# Patient Record
Sex: Female | Born: 1995 | Race: White | Hispanic: No | Marital: Single | Smoking: Never smoker
Health system: Southern US, Community
[De-identification: ages and names within clinical notes are randomized; demographics above are authoritative.]

## PROBLEM LIST (undated history)

## (undated) HISTORY — PX: WISDOM TOOTH EXTRACTION: SHX21

## (undated) HISTORY — PX: KNEE SURGERY: SHX244

## (undated) HISTORY — PX: TONSILLECTOMY: SUR1361

---
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
## 2013-02-15 ENCOUNTER — Ambulatory Visit: Payer: Self-pay | Admitting: Physician Assistant

## 2013-02-15 LAB — URINALYSIS, COMPLETE
BILIRUBIN, UR: NEGATIVE
Glucose,UR: NEGATIVE mg/dL (ref 0–75)
Ketone: NEGATIVE
LEUKOCYTE ESTERASE: NEGATIVE
Nitrite: NEGATIVE
PROTEIN: NEGATIVE
Ph: 6 (ref 4.5–8.0)
Specific Gravity: 1.025 (ref 1.003–1.030)

## 2013-02-15 LAB — PREGNANCY, URINE: Pregnancy Test, Urine: NEGATIVE m[IU]/mL

## 2015-02-01 DIAGNOSIS — Z3493 Encounter for supervision of normal pregnancy, unspecified, third trimester: Secondary | ICD-10-CM | POA: Insufficient documentation

## 2015-04-20 DIAGNOSIS — Z975 Presence of (intrauterine) contraceptive device: Secondary | ICD-10-CM | POA: Insufficient documentation

## 2017-03-21 ENCOUNTER — Other Ambulatory Visit: Payer: Self-pay

## 2017-03-21 ENCOUNTER — Ambulatory Visit
Admission: EM | Admit: 2017-03-21 | Discharge: 2017-03-21 | Disposition: A | Discharge status: Home / Self Care | Payer: Federal, State, Local not specified - PPO | Attending: Family Medicine | Admitting: Family Medicine

## 2017-03-21 DIAGNOSIS — N3000 Acute cystitis without hematuria: Secondary | ICD-10-CM

## 2017-03-21 LAB — URINALYSIS, COMPLETE (UACMP) WITH MICROSCOPIC
BILIRUBIN URINE: NEGATIVE
Bacteria, UA: NONE SEEN
GLUCOSE, UA: NEGATIVE mg/dL
Hgb urine dipstick: NEGATIVE
KETONES UR: NEGATIVE mg/dL
NITRITE: NEGATIVE
Protein, ur: NEGATIVE mg/dL
RBC / HPF: NONE SEEN RBC/hpf (ref 0–5)
Specific Gravity, Urine: 1.015 (ref 1.005–1.030)
pH: 8.5 — ABNORMAL HIGH (ref 5.0–8.0)

## 2017-03-21 LAB — PREGNANCY, URINE: Preg Test, Ur: NEGATIVE

## 2017-03-21 MED ORDER — CEPHALEXIN 500 MG PO CAPS: 500.0000 mg | ORAL_CAPSULE | Freq: Two times a day (BID) | ORAL | 0 refills | Status: DC

## 2017-03-21 NOTE — Discharge Instructions (Signed)
Antibiotic as prescribed.  If you worsen, please be re-evaluated.  Take care  Dr. Adriana Simasook

## 2017-03-21 NOTE — ED Triage Notes (Signed)
Patient complains of urinary urgency, frequency and lower abdominal pain. Patient states that she had UTI symptoms around 3 weeks ago and took some leftover antibiotics from her mother. Patient states that the symptoms returned last night.

## 2017-03-21 NOTE — ED Provider Notes (Signed)
[facility]    CSN: [id number] Arrival date & time: 03/21/17  0801  History   Chief Complaint Chief Complaint  Patient presents with  . Dysuria   HPI  22 year old female presents with lower abdominal pain.  Patient states that approximately 3 weeks ago she felt as if she had a UTI.  She took some antibiotics from her mother.  She states that her symptoms resolved.  Last night she developed lower abdominal pain.  She states that she has had associated urinary frequency and strong odor to her urine.  She states that she has had pain with intercourse recently as well.  No fevers or chills.  No known exacerbating relieving factors.  No reports of flank pain.  No other associated symptoms.  No other complaints at this time.  PMH - Anxiety, Depression, Hx of STD  Past Surgical History:  Procedure Laterality Date  . KNEE SURGERY Right   . TONSILLECTOMY    . WISDOM TOOTH EXTRACTION      Home Medications    Family History Thyroid disease Brother    Endometrial cancer Maternal Grandmother    Hypertension Maternal Grandmother    Hypothyroidism Mother     Social History Social History   Tobacco Use  . Smoking status: Never Smoker  . Smokeless tobacco: Never Used  Substance Use Topics  . Alcohol use: Yes    Comment: occasionally  . Drug use: No   Allergies   Sulfa antibiotics   Review of Systems Review of Systems  Constitutional: Negative.   Gastrointestinal: Positive for abdominal pain.  Genitourinary: Positive for dysuria and frequency.   Physical Exam Triage Vital Signs ED Triage Vitals  Enc Vitals Group     BP 03/21/17 0817 116/61     Pulse Rate 03/21/17 0817 93     Resp 03/21/17 0817 18     Temp 03/21/17 0817 98.9 F (37.2 C)     Temp Source 03/21/17 0817 Oral     SpO2 03/21/17 0817 99 %     Weight 03/21/17 0814 210 lb (95.3 kg)     Height 03/21/17 0814 5\' 6"  (1.676 m)     Head Circumference --      Peak Flow --      Pain Score  03/21/17 0814 5     Pain Loc --      Pain Edu? --      Excl. in GC? --    Updated Vital Signs BP 116/61 (BP Location: Left Arm)   Pulse 93   Temp 98.9 F (37.2 C) (Oral)   Resp 18   Ht 5\' 6"  (1.676 m)   Wt 210 lb (95.3 kg)   LMP  (LMP Unknown) Comment: irregular cycles  SpO2 99%   BMI 33.89 kg/m   Physical Exam  Constitutional: She is oriented to person, place, and time. She appears well-developed and well-nourished. No distress.  Cardiovascular: Normal rate and regular rhythm.  No murmur heard. Pulmonary/Chest: Effort normal and breath sounds normal. She has no wheezes. She has no rales.  Abdominal: Soft.  Nondistended.  Mild suprapubic tenderness.  Neurological: She is alert and oriented to person, place, and time.  Psychiatric: She has a normal mood and affect. Her behavior is normal.  Nursing note and vitals reviewed.  UC Treatments / Results  Labs (all labs ordered are listed, but only abnormal results are displayed) Labs Reviewed  URINALYSIS, COMPLETE (UACMP) WITH MICROSCOPIC - Abnormal; Notable for the following components:  Result Value   pH 8.5 (*)    Leukocytes, UA TRACE (*)    Squamous Epithelial / LPF 6-30 (*)    All other components within normal limits  URINE CULTURE  PREGNANCY, URINE    EKG  EKG Interpretation None       Radiology No results found.  Procedures Procedures (including critical care time)  Medications Ordered in UC Medications - No data to display   Initial Impression / Assessment and Plan / UC Course  I have reviewed the triage vital signs and the nursing notes.  Pertinent labs & imaging results that were available during my care of the patient were reviewed by me and considered in my medical decision making (see chart for details).    22 year old female presents with concerns for UTI.  Trace leukocytes on urinalysis.  Given recent symptoms as well as likely incomplete and inappropriate treatment, I am sending culture  and treating empirically with Keflex.  Final Clinical Impressions(s) / UC Diagnoses   Final diagnoses:  Acute cystitis without hematuria    ED Discharge Orders        Ordered    cephALEXin (KEFLEX) 500 MG capsule  2 times daily     03/21/17 0837     Controlled Substance Prescriptions [place] Controlled Substance Registry consulted? Not Applicable   Tommie SamsCook, Dwanda Tufano G, [place] 03/21/17 [phone]

## 2017-03-22 LAB — URINE CULTURE

## 2017-03-25 DIAGNOSIS — Z3041 Encounter for surveillance of contraceptive pills: Secondary | ICD-10-CM | POA: Insufficient documentation

## 2017-03-25 DIAGNOSIS — Z6839 Body mass index (BMI) 39.0-39.9, adult: Secondary | ICD-10-CM | POA: Insufficient documentation

## 2017-04-02 ENCOUNTER — Telehealth: Payer: Self-pay | Admitting: Emergency Medicine

## 2017-04-02 NOTE — Telephone Encounter (Signed)
Patient called after received letter re: lab results. Advised patient of urine culture results. Patient states that she was not getting better and went to the ED for evaluation and was diagnosed with something else.

## 2017-08-11 DIAGNOSIS — R519 Headache, unspecified: Secondary | ICD-10-CM | POA: Insufficient documentation

## 2017-10-26 DIAGNOSIS — O3680X0 Pregnancy with inconclusive fetal viability, not applicable or unspecified: Secondary | ICD-10-CM | POA: Insufficient documentation

## 2018-09-06 ENCOUNTER — Other Ambulatory Visit: Payer: Self-pay

## 2018-09-06 ENCOUNTER — Ambulatory Visit
Admission: EM | Admit: 2018-09-06 | Discharge: 2018-09-06 | Disposition: A | Discharge status: Home / Self Care | Payer: Federal, State, Local not specified - PPO | Attending: Family Medicine | Admitting: Family Medicine

## 2018-09-06 DIAGNOSIS — H00011 Hordeolum externum right upper eyelid: Secondary | ICD-10-CM | POA: Diagnosis not present

## 2018-09-06 MED ORDER — POLYMYXIN B-TRIMETHOPRIM 10000-0.1 UNIT/ML-% OP SOLN: 1.0000 [drp] | Freq: Four times a day (QID) | OPHTHALMIC | 0 refills | Status: AC

## 2018-09-06 NOTE — ED Provider Notes (Signed)
[facility]    CSN: [id number] Arrival date & time: 09/06/18  0854  History   Chief Complaint Chief Complaint  Patient presents with  . Eye Problem   HPI  23 year old female presents with the above complaint.  Patient reports that she suffered some eyelid pain approximately 3 days ago.  Did not notice swelling until yesterday.  She reports right upper eyelid swelling.  No drainage from the eye.  No eye redness.  Mild pain in the right upper eyelid.  Tender to touch and worse with blinking.  No known inciting factor.  No medications for interventions tried.  No known exacerbating factors.  No other complaints.  History reviewed as below. PMH - Anxiety, Depression, Hx of STD  Past Surgical History:  Procedure Laterality Date  . KNEE SURGERY Right   . TONSILLECTOMY    . WISDOM TOOTH EXTRACTION      OB History   No obstetric history on file.    Home Medications    Prior to Admission medications   Medication Sig Start Date End Date Taking? Authorizing Provider  NIKKI 3-0.02 MG tablet  07/16/18  Yes [provider]  cephALEXin (KEFLEX) 500 MG capsule Take 1 capsule (500 mg total) by mouth 2 (two) times daily. 03/21/17   Tommie Samsook, Breniyah Romm G, DO  trimethoprim-polymyxin b (POLYTRIM) ophthalmic solution Place 1 drop into the right eye every 6 (six) hours for 5 days. 09/06/18 09/11/18  Tommie Samsook, Aalyah Mansouri G, DO    Family History Family History  Problem Relation Age of Onset  . Healthy Mother   . Healthy Father     Social History Social History   Tobacco Use  . Smoking status: Never Smoker  . Smokeless tobacco: Never Used  Substance Use Topics  . Alcohol use: Yes    Comment: occasionally  . Drug use: No     Allergies   Sulfa antibiotics   Review of Systems Review of Systems  Constitutional: Negative.   Eyes: Negative for discharge, redness and visual disturbance.       Right upper eyelid swelling.   Physical Exam Triage Vital Signs ED Triage Vitals  Enc  Vitals Group     BP 09/06/18 0906 114/80     Pulse Rate 09/06/18 0906 86     Resp 09/06/18 0906 18     Temp 09/06/18 0906 99 F (37.2 C)     Temp Source 09/06/18 0906 Oral     SpO2 09/06/18 0906 100 %     Weight 09/06/18 0909 240 lb (108.9 kg)     Height --      Head Circumference --      Peak Flow --      Pain Score 09/06/18 0909 2     Pain Loc --      Pain Edu? --      Excl. in GC? --    Updated Vital Signs BP 114/80 (BP Location: Right Arm)   Pulse 86   Temp 99 F (37.2 C) (Oral)   Resp 18   Wt 108.9 kg   LMP  (LMP Unknown)   SpO2 100%   BMI 38.74 kg/m   Visual Acuity Right Eye Distance:   Left Eye Distance:   Bilateral Distance:    Right Eye Near:   Left Eye Near:    Bilateral Near:     Physical Exam Vitals signs and nursing note reviewed.  Constitutional:      General: She is not in acute distress.  Appearance: Normal appearance.  HENT:     Head: Normocephalic and atraumatic.     Nose: Nose normal.  Eyes:     General:        Right eye: No discharge.        Left eye: No discharge.     Conjunctiva/sclera: Conjunctivae normal.     Comments: Right upper eyelid with diffuse edema.  Cardiovascular:     Rate and Rhythm: Normal rate and regular rhythm.  Pulmonary:     Effort: Pulmonary effort is normal.     Breath sounds: Normal breath sounds.  Neurological:     Mental Status: She is alert.  Psychiatric:        Mood and Affect: Mood normal.        Behavior: Behavior normal.    UC Treatments / Results  Labs (all labs ordered are listed, but only abnormal results are displayed) Labs Reviewed - No data to display  EKG   Radiology No results found.  Procedures Procedures (including critical care time)  Medications Ordered in UC Medications - No data to display  Initial Impression / Assessment and Plan / UC Course  I have reviewed the triage vital signs and the nursing notes.  Pertinent labs & imaging results that were available during my  care of the patient were reviewed by me and considered in my medical decision making (see chart for details).    23 year old female presents with a stye.  Warm compresses.  Polytrim as directed.  Final Clinical Impressions(s) / UC Diagnoses   Final diagnoses:  Hordeolum externum of right upper eyelid     Discharge Instructions     Warm compresses.  Eye drop as prescribed.  Take care  Dr. Lacinda Axon    ED Prescriptions    Medication Sig Dispense Auth. Provider   trimethoprim-polymyxin b (POLYTRIM) ophthalmic solution Place 1 drop into the right eye every 6 (six) hours for 5 days. 10 mL Coral Spikes, DO     Controlled Substance Prescriptions [place] Controlled Substance Registry consulted? Not Applicable   Coral Spikes, DO 09/06/18 1011

## 2018-09-06 NOTE — ED Triage Notes (Signed)
Pt here for right eyelid pain 3 days ago and then work up yesterday with swelling. No problem with her eyes reported. States she hasn't changed her contacts in 3 months. No itching reported. Painful to touch and blink

## 2018-09-06 NOTE — Discharge Instructions (Signed)
Warm compresses.  Eye drop as prescribed.  Take care  Dr. Lacinda Axon

## 2021-03-28 ENCOUNTER — Ambulatory Visit: Payer: Federal, State, Local not specified - PPO | Admitting: Psychiatry

## 2021-03-28 ENCOUNTER — Encounter: Payer: Self-pay | Admitting: Psychiatry

## 2021-03-28 ENCOUNTER — Other Ambulatory Visit: Payer: Self-pay

## 2021-03-29 DIAGNOSIS — F411 Generalized anxiety disorder: Secondary | ICD-10-CM | POA: Insufficient documentation

## 2021-03-29 NOTE — Progress Notes (Signed)
Error

## 2022-04-26 ENCOUNTER — Telehealth: Payer: Self-pay | Admitting: Emergency Medicine

## 2022-04-26 ENCOUNTER — Ambulatory Visit
Admission: EM | Admit: 2022-04-26 | Discharge: 2022-04-26 | Disposition: A | Discharge status: Home / Self Care | Payer: Federal, State, Local not specified - PPO | Attending: Emergency Medicine | Admitting: Emergency Medicine

## 2022-04-26 DIAGNOSIS — R1031 Right lower quadrant pain: Secondary | ICD-10-CM | POA: Insufficient documentation

## 2022-04-26 DIAGNOSIS — R1032 Left lower quadrant pain: Secondary | ICD-10-CM

## 2022-04-26 LAB — WET PREP, GENITAL
Sperm: NONE SEEN
Trich, Wet Prep: NONE SEEN
WBC, Wet Prep HPF POC: >10 — AB (ref <10)

## 2022-04-26 LAB — HCG, QUANTITATIVE, PREGNANCY: hCG, Beta Chain, Quant, S: <1 m[IU]/mL (ref <5)

## 2022-04-26 MED ORDER — METRONIDAZOLE 500 MG PO TABS: 500.0000 mg | ORAL_TABLET | Freq: Two times a day (BID) | ORAL | 0 refills | Status: AC

## 2022-04-26 MED ORDER — FLUCONAZOLE 150 MG PO TABS: ORAL_TABLET | ORAL | 0 refills | Status: AC

## 2022-04-26 MED ORDER — FLUCONAZOLE 150 MG PO TABS: 150.0000 mg | ORAL_TABLET | Freq: Every day | ORAL | 0 refills | Status: DC

## 2022-04-26 MED ORDER — METRONIDAZOLE 500 MG PO TABS: 500.0000 mg | ORAL_TABLET | Freq: Two times a day (BID) | ORAL | 0 refills | Status: DC

## 2022-04-26 NOTE — Telephone Encounter (Signed)
Attempted to notify of lab results, no answer, left message, medication sent pharmacy on file

## 2022-04-26 NOTE — Telephone Encounter (Signed)
Patient returned call, discussed lab results as well as medication administration, all questions answered, may follow-up as needed

## 2022-04-26 NOTE — ED Triage Notes (Signed)
Pt is taking Accutane and is 2 weeks late on her period. Pt asks for a blood test to be pregnancy.

## 2022-04-26 NOTE — Discharge Instructions (Signed)
Today you are being treated for your abdominal cramping  hCG level was 1 which is negative for pregnancy  Will rule out bacterial vaginosis as a cause of your symptoms, wet prep is pending, you will be notified of test results via telephone, if positive antibiotic will be sent to pharmacy  If negative you will receive a muscle relaxant which you may use for your abdominal pain as needed, be mindful this may make you feel drowsy  Please follow-up with your gynecologist if your abdominal pain continues to occur for further evaluation and management

## 2022-04-26 NOTE — ED Provider Notes (Signed)
[facility]    CSN: [id number] Arrival date & time: 04/26/22  1455      History   Chief Complaint Chief Complaint  Patient presents with   Possible Pregnancy    HPI Samantha Salinas is a 27 y.o. female.   Patient presents for evaluation of lower abdominal cramping and a decreased appetite for 2 weeks.  Symptoms worsened over the last week.  Menses is also late by 2 weeks.  Typically has monthly menstruation starting around the same time.  Has Nexplanon implanted.  Home pregnancy test negative.  Denies nausea, vomiting, diarrhea, urinary or vaginal symptoms.        History reviewed. No pertinent past medical history.  Patient Active Problem List   Diagnosis Date Noted   Anxiety state 03/29/2021    Past Surgical History:  Procedure Laterality Date   KNEE SURGERY Right    TONSILLECTOMY     WISDOM TOOTH EXTRACTION      OB History   No obstetric history on file.      Home Medications    Prior to Admission medications   Medication Sig Start Date End Date Taking? Authorizing Provider  buPROPion (WELLBUTRIN) 75 MG tablet Take 75 mg by mouth 2 (two) times daily. 03/22/21  Yes [provider]  citalopram (CELEXA) 20 MG tablet Take 20 mg by mouth daily. 03/22/21  Yes [provider]  etonogestrel (NEXPLANON) 68 MG IMPL implant 1 each by Subdermal route once.   Yes [provider]  isotretinoin (ACCUTANE) 10 MG capsule Take 10 mg by mouth 2 (two) times daily.   Yes [provider]  propranolol (INDERAL) 10 MG tablet Take by mouth. 01/16/21 01/16/22  [provider]    Family History Family History  Problem Relation Age of Onset   Anxiety disorder Mother    Healthy Mother    Healthy Father    Anxiety disorder Maternal Grandmother     Social History Social History   Tobacco Use   Smoking status: Never   Smokeless tobacco: Never  Vaping Use   Vaping Use: Some days   Substances: THC  Substance Use Topics    Alcohol use: Not Currently    Comment: occasionally   Drug use: Yes    Comment: THC     Allergies   Sulfa antibiotics   Review of Systems Review of Systems   Physical Exam Triage Vital Signs ED Triage Vitals  Enc Vitals Group     BP 04/26/22 1605 128/89     Pulse Rate 04/26/22 1605 89     Resp --      Temp 04/26/22 1605 98 F (36.7 C)     Temp Source 04/26/22 1605 Oral     SpO2 04/26/22 1605 100 %     Weight 04/26/22 1602 250 lb (113.4 kg)     Height 04/26/22 1602 5\' 6"  (1.676 m)     Head Circumference --      Peak Flow --      Pain Score 04/26/22 1602 2     Pain Loc --      Pain Edu? --      Excl. in [place]? --    No data found.  Updated Vital Signs BP 128/89 (BP Location: Left Arm)   Pulse 89   Temp 98 F (36.7 C) (Oral)   Ht 5\' 6"  (1.676 m)   Wt 250 lb (113.4 kg)   LMP 03/07/2022   SpO2 100%   BMI 40.35  kg/m   Visual Acuity Right Eye Distance:   Left Eye Distance:   Bilateral Distance:    Right Eye Near:   Left Eye Near:    Bilateral Near:     Physical Exam Constitutional:      Appearance: Normal appearance.  Eyes:     Extraocular Movements: Extraocular movements intact.  Pulmonary:     Effort: Pulmonary effort is normal.  Abdominal:     General: Abdomen is flat.     Palpations: Abdomen is soft.  Genitourinary:    Comments: deferred Neurological:     Mental Status: She is alert and oriented to person, place, and time. Mental status is at baseline.      UC Treatments / Results  Labs (all labs ordered are listed, but only abnormal results are displayed) Labs Reviewed  HCG, QUANTITATIVE, PREGNANCY    EKG   Radiology No results found.  Procedures Procedures (including critical care time)  Medications Ordered in UC Medications - No data to display  Initial Impression / Assessment and Plan / UC Course  I have reviewed the triage vital signs and the nursing notes.  Pertinent labs & imaging results that were available during  my care of the patient were reviewed by me and considered in my medical decision making (see chart for details).  BiLateral lower abdominal cramping  hCG level negative, discussed with patient, wet prep is pending, will treat per protocol, if negative will prescribe muscle relaxant, discussed this with patient, advised follow-up with PCP if all testing today is negative Final Clinical Impressions(s) / UC Diagnoses   Final diagnoses:  None   Discharge Instructions   None    ED Prescriptions   None    PDMP not reviewed this encounter.   Hans Eden, NP 04/26/22 [phone]

## 2022-04-27 ENCOUNTER — Ambulatory Visit: Payer: Self-pay

## 2023-07-16 ENCOUNTER — Encounter: Payer: Self-pay | Admitting: Podiatry

## 2023-07-16 ENCOUNTER — Ambulatory Visit (INDEPENDENT_AMBULATORY_CARE_PROVIDER_SITE_OTHER)

## 2023-07-16 ENCOUNTER — Ambulatory Visit (INDEPENDENT_AMBULATORY_CARE_PROVIDER_SITE_OTHER): Admitting: Podiatry

## 2023-07-16 DIAGNOSIS — M7752 Other enthesopathy of left foot: Secondary | ICD-10-CM

## 2023-07-16 DIAGNOSIS — M24469 Recurrent dislocation, unspecified knee: Secondary | ICD-10-CM | POA: Insufficient documentation

## 2023-07-16 DIAGNOSIS — M2012 Hallux valgus (acquired), left foot: Secondary | ICD-10-CM

## 2023-07-16 MED ORDER — TRIAMCINOLONE ACETONIDE 40 MG/ML IJ SUSP
20.0000 mg | Freq: Once | INTRAMUSCULAR | Status: AC
  Administered 2023-07-16: 20 mg

## 2023-07-16 NOTE — Progress Notes (Signed)
 Subjective:  Patient ID: Samantha Salinas, female    DOB: [birth year],  MRN: [id number] HPI Chief Complaint  Patient presents with   Foot Pain    1st MPJ left - bunion deformity x years, said surgery was needed when she was in high school, but wasn't really hurting then, but now has started to    New Patient (Initial Visit)  She tried cold things to get relief including wider shoe gear anti-inflammatories stretching and at home physical therapy.  She has tried inserts all to no avail.  28 y.o. female presents with the above complaint.   ROS: Denies fever chills nausea body muscle aches pains calf pain back pain chest pain shortness of breath.  No past medical history on file. Past Surgical History:  Procedure Laterality Date   KNEE SURGERY Right    TONSILLECTOMY     WISDOM TOOTH EXTRACTION      Current Outpatient Medications:    topiramate (TOPAMAX) 50 MG tablet, Take 50 mg by mouth., Disp: , Rfl:    buPROPion (WELLBUTRIN) 75 MG tablet, Take 75 mg by mouth 2 (two) times daily., Disp: , Rfl:    citalopram (CELEXA) 20 MG tablet, Take 20 mg by mouth daily., Disp: , Rfl:    etonogestrel (NEXPLANON) 68 MG IMPL implant, 1 each by Subdermal route once., Disp: , Rfl:    fluconazole  (DIFLUCAN ) 150 MG tablet, Take 1 tablet today and then in 8 days take second tablet, Disp: 2 tablet, Rfl: 0   isotretinoin (ACCUTANE) 10 MG capsule, Take 10 mg by mouth 2 (two) times daily., Disp: , Rfl:    metroNIDAZOLE  (FLAGYL ) 500 MG tablet, Take 1 tablet (500 mg total) by mouth 2 (two) times daily., Disp: 14 tablet, Rfl: 0   propranolol (INDERAL) 10 MG tablet, Take by mouth., Disp: , Rfl:   Allergies  Allergen Reactions   Sulfa Antibiotics Anaphylaxis   Sulfasalazine Anaphylaxis    Tongue and throat swelled   Latex Dermatitis   Review of Systems Objective:  There were no vitals filed for this visit.  General: Well developed, nourished, in no acute distress, alert and oriented x3   Dermatological: Skin  is warm, dry and supple bilateral. Nails x 10 are well maintained; remaining integument appears unremarkable at this time. There are no open sores, no preulcerative lesions, no rash or signs of infection present.  Vascular: Dorsalis Pedis artery and Posterior Tibial artery pedal pulses are 2/4 bilateral with immedate capillary fill time. Pedal hair growth present. No varicosities and no lower extremity edema present bilateral.   Neruologic: Grossly intact via light touch bilateral. Vibratory intact via tuning fork bilateral. Protective threshold with Semmes Wienstein monofilament intact to all pedal sites bilateral. Patellar and Achilles deep tendon reflexes 2+ bilateral. No Babinski or clonus noted bilateral.   Musculoskeletal: No gross boney pedal deformities bilateral. No pain, crepitus, or limitation noted with foot and ankle range of motion bilateral. Muscular strength 5/5 in all groups tested bilateral.  Moderate to severe hallux abductovalgus deformity of the left foot with pain on range of motion of the first metatarsal phalangeal joint and with some crepitation.  She also has hypertrophic medial condyle which is exquisitely painful.  This is not completely reducible appears to be tracking but not yet track bound.  Gait: Unassisted, Nonantalgic.    Radiographs:  Radiographs of the bilateral foot today demonstrate osseously mature individual increase in the first intermetatarsal angle greater than 12 degrees.  Hallux abductus angle greater than 27 degrees.  Early dislocation of the first metatarsal phalangeal joint left over right.  Osseously good bone mineralization.  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  Plan: Discussed etiology pathology conservative versus surgical therapies at this point after sterile Betadine skin prep I injected 4 mg of dexamethasone and local anesthetic to  the point of maximal tenderness into the joint.  She tolerated this well.  We also consented her for a November surgery consisting of an [facility] bunion repair first metatarsal phalangeal joint of the left foot with double screw fixation.  We did discuss the possible postop complications which may include but are not limited to postop pain bleeding swelling infection recurrence need for further surgery overcorrection under correction loss of digit loss limb loss of life.  She signed all 3 pages of the consent form we will follow-up with her in the near future or as needed.     Arina Torry T. [place], [place]

## 2023-07-17 ENCOUNTER — Encounter: Payer: Self-pay | Admitting: Podiatry

## 2023-07-22 ENCOUNTER — Telehealth: Payer: Self-pay | Admitting: Podiatry

## 2023-07-22 NOTE — Telephone Encounter (Signed)
 Pt called and left message stating she was seen last week in [place] and needed to schedule bunion surgery.  I returned call and pt is scheduled for 12/19/23 per her request. She is not on any blood thinners and not taking any GLP1 medications. Verified pharmacy as well.

## 2023-11-10 DIAGNOSIS — Z0279 Encounter for issue of other medical certificate: Secondary | ICD-10-CM

## 2023-11-12 ENCOUNTER — Telehealth: Payer: Self-pay | Admitting: Podiatry

## 2023-11-12 NOTE — Telephone Encounter (Signed)
 Recd [facility] forms and pt paid 25.00 fee Pt to pick up forms from the [facility] office. Faxed to [facility] office and adv pt ready for pick up

## 2023-12-17 ENCOUNTER — Other Ambulatory Visit: Payer: Self-pay | Admitting: Podiatry

## 2023-12-17 MED ORDER — CEPHALEXIN 500 MG PO CAPS: 500.0000 mg | ORAL_CAPSULE | Freq: Three times a day (TID) | ORAL | 0 refills | Status: AC

## 2023-12-17 MED ORDER — ONDANSETRON HCL 4 MG PO TABS: 4.0000 mg | ORAL_TABLET | Freq: Three times a day (TID) | ORAL | 0 refills | Status: AC | PRN

## 2023-12-17 MED ORDER — OXYCODONE-ACETAMINOPHEN 10-325 MG PO TABS: 1.0000 | ORAL_TABLET | Freq: Three times a day (TID) | ORAL | 0 refills | Status: AC | PRN

## 2023-12-19 DIAGNOSIS — M7752 Other enthesopathy of left foot: Secondary | ICD-10-CM | POA: Diagnosis not present

## 2023-12-19 DIAGNOSIS — M2012 Hallux valgus (acquired), left foot: Secondary | ICD-10-CM | POA: Diagnosis not present

## 2023-12-24 ENCOUNTER — Ambulatory Visit: Admitting: Podiatry

## 2023-12-24 ENCOUNTER — Ambulatory Visit

## 2023-12-24 ENCOUNTER — Encounter: Payer: Self-pay | Admitting: Podiatry

## 2023-12-24 DIAGNOSIS — M7752 Other enthesopathy of left foot: Secondary | ICD-10-CM

## 2023-12-24 DIAGNOSIS — M2012 Hallux valgus (acquired), left foot: Secondary | ICD-10-CM | POA: Diagnosis not present

## 2023-12-24 NOTE — Progress Notes (Signed)
  Subjective:  Patient ID: Samantha Salinas, female    DOB: [birth year],  MRN: [id number]  Chief Complaint  Patient presents with   Routine Post Op    POV # 1 DOS 12/19/23 LT [place] BUNIONECTOMY W/ SCREW FIXATION( [facility]ATT PT      28 y.o. female returns for post-op check.    Review of Systems: Negative except as noted in the HPI. Denies N/V/F/Ch.   Objective:  There were no vitals filed for this visit. There is no height or weight on file to calculate BMI. Constitutional Well developed. Well nourished.  Vascular Foot warm and well perfused. Capillary refill normal to all digits.  Calf is soft and supple, no posterior calf or knee pain, negative Homans' sign  Neurologic Normal speech. Oriented to person, place, and time. Epicritic sensation to light touch grossly present bilaterally.  Dermatologic Skin healing well without signs of infection. Skin edges well coapted without signs of infection.  Orthopedic: Tenderness to palpation noted about the surgical site.   Multiple view plain film radiographs: Good correction noted screw fixation intact Assessment:   1. Acquired hallux valgus of left foot   2. Capsulitis of metatarsophalangeal (MTP) joint of left foot    Plan:  Patient was evaluated and treated and all questions answered.  S/p foot surgery left -Progressing as expected post-operatively. -XR: Noted above no issues -WB Status: WBAT in CAM boot -Sutures: Return 2 weeks for removal. -Medications: No refills required -Foot redressed.  Okay to remove on Monday and bathe foot  No follow-ups on file.

## 2023-12-29 ENCOUNTER — Telehealth: Payer: Self-pay

## 2024-01-07 ENCOUNTER — Encounter: Admitting: Podiatry

## 2024-01-07 DIAGNOSIS — M2012 Hallux valgus (acquired), left foot: Secondary | ICD-10-CM

## 2024-01-08 ENCOUNTER — Ambulatory Visit (INDEPENDENT_AMBULATORY_CARE_PROVIDER_SITE_OTHER)

## 2024-01-08 DIAGNOSIS — M2012 Hallux valgus (acquired), left foot: Secondary | ICD-10-CM

## 2024-01-08 DIAGNOSIS — Z9889 Other specified postprocedural states: Secondary | ICD-10-CM

## 2024-01-08 DIAGNOSIS — M7752 Other enthesopathy of left foot: Secondary | ICD-10-CM

## 2024-01-08 NOTE — Progress Notes (Signed)
°  Subjective:  Patient ID: Samantha Salinas, female    DOB: 1995-02-17,  MRN: [id number]  Chief Complaint  Patient presents with   Routine Post Op    POV # 2 DOS 12/19/23 LT [place] BUNIONECTOMY W/ SCREW FIXATION( [facility] PT). She has a bit of soreness, redness and is not using the boot inside the house       28 y.o. female returns for post-op check.  2.5 weeks s/p L [place] bunionectomy. Doing well. She does admit to taking the CAM walker off while walking around the house.   Review of Systems: Negative except as noted in the HPI. Denies N/V/F/Ch.   Objective:  There were no vitals filed for this visit. There is no height or weight on file to calculate BMI. Constitutional Well developed. Well nourished.  Vascular Foot warm and well perfused. Capillary refill normal to all digits.  Calf is soft and supple, no posterior calf or knee pain, negative Homans' sign  Neurologic Normal speech. Oriented to person, place, and time. Epicritic sensation to light touch grossly present bilaterally.  Dermatologic Skin healing well without signs of infection. Sutures intact. Skin edges well coapted without signs of infection.  Orthopedic: Tenderness to palpation noted about the surgical site.   Assessment:   1. Acquired hallux valgus of left foot   2. Capsulitis of metatarsophalangeal (MTP) joint of left foot   3. Status post left foot surgery     Plan:  Patient was evaluated and treated and all questions answered.  S/p foot surgery left -Progressing as expected post-operatively. Doing well.  -XR: Not needed at today's visit -WB Status: WBAT in CAM boot. Use CAM boot whenever ambulating.  -Sutures: Removed today without incident. Incision well healed. Benzoin tincture and steri strips applied.  -Medications: No refills required -Foot redressed.  Let steri strips fall off naturally.  - RTC 4 weeks with pre-clinical XR of left foot.   No follow-ups on file.

## 2024-01-26 ENCOUNTER — Ambulatory Visit (INDEPENDENT_AMBULATORY_CARE_PROVIDER_SITE_OTHER): Admitting: Podiatry

## 2024-01-26 ENCOUNTER — Ambulatory Visit (INDEPENDENT_AMBULATORY_CARE_PROVIDER_SITE_OTHER)

## 2024-01-26 DIAGNOSIS — M2012 Hallux valgus (acquired), left foot: Secondary | ICD-10-CM | POA: Diagnosis not present

## 2024-01-26 NOTE — Progress Notes (Signed)
 She presents today for postop visit date of surgery was November 2025.  She states that she is doing quite well she presents today with her slip on this.  Objective: Vital signs stable alert oriented x 3 there is no erythema edema cellulitis drainage or odor she has some tenderness on palpation of the incision site.  She has great range of motion on dorsiflexion plantarflexion mild hallux valgus is still present.  Radiographs taken today demonstrate osseously mature individual screw fixation is intact.  Assessment: Well-healing surgical foot.  Plan: Follow-up with me in 1 month so that we may release her to go back to work.  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  And we will take another set of x-rays to assure the healing of that bone.

## 2024-02-18 ENCOUNTER — Encounter: Payer: Self-pay | Admitting: Podiatry

## 2024-02-18 ENCOUNTER — Ambulatory Visit: Admitting: Podiatry

## 2024-03-01 ENCOUNTER — Ambulatory Visit: Admitting: Podiatry

## 2024-03-03 ENCOUNTER — Encounter: Payer: Self-pay | Admitting: Podiatry

## 2024-03-03 ENCOUNTER — Ambulatory Visit: Admitting: Podiatry

## 2024-03-03 ENCOUNTER — Ambulatory Visit (INDEPENDENT_AMBULATORY_CARE_PROVIDER_SITE_OTHER)

## 2024-03-03 DIAGNOSIS — M2012 Hallux valgus (acquired), left foot: Secondary | ICD-10-CM

## 2024-03-04 NOTE — Progress Notes (Signed)
 She presents today date of surgery 12/19/2023 status post [facility] bunion repair left foot.  States that is doing pretty good I just did not like having to wait for over an hour today.  She is ready to go back to work full duty as soon as possible.  Objective: Vital signs are stable alert and oriented x 3.  Pulses are palpable.  There is no erythema just a mild edema no cellulitis drainage odor incision site is healing very nicely she has good range of motion of the first metatarsal phalangeal joint.  Radiographs demonstrate well-healing capital osteotomy with screw fixation.  Internal fixation is intact and in good position.  No fractures dorsal spurring noted.  Assessment well-healing surgical foot.  Plan: Allow her to get back to her regular activities recommended that she have no high-impact activities until about April she understands this and is amenable to it.  We did have an issue today with her not being checking completely which resulted in her weight as she did not show up on the back schedule as checked in.
